# Patient Record
Sex: Female | Born: 1965
Health system: Southern US, Academic
[De-identification: ages and names within clinical notes are randomized; demographics above are authoritative.]

## PROBLEM LIST (undated history)

## (undated) DIAGNOSIS — F329 Major depressive disorder, single episode, unspecified: Secondary | ICD-10-CM

## (undated) DIAGNOSIS — K219 Gastro-esophageal reflux disease without esophagitis: Secondary | ICD-10-CM

## (undated) DIAGNOSIS — I10 Essential (primary) hypertension: Secondary | ICD-10-CM

## (undated) DIAGNOSIS — L409 Psoriasis, unspecified: Secondary | ICD-10-CM

## (undated) DIAGNOSIS — L4050 Arthropathic psoriasis, unspecified: Secondary | ICD-10-CM

## (undated) DIAGNOSIS — M5090 Cervical disc disorder, unspecified, unspecified cervical region: Secondary | ICD-10-CM

## (undated) DIAGNOSIS — E282 Polycystic ovarian syndrome: Secondary | ICD-10-CM

## (undated) DIAGNOSIS — F32A Depression, unspecified: Secondary | ICD-10-CM

## (undated) DIAGNOSIS — G43909 Migraine, unspecified, not intractable, without status migrainosus: Secondary | ICD-10-CM

## (undated) DIAGNOSIS — M722 Plantar fascial fibromatosis: Secondary | ICD-10-CM

## (undated) HISTORY — DX: Polycystic ovarian syndrome: E28.2

## (undated) HISTORY — DX: Plantar fascial fibromatosis: M72.2

## (undated) HISTORY — PX: CHOLECYSTECTOMY: SHX55

## (undated) HISTORY — DX: Cervical disc disorder, unspecified, unspecified cervical region: M50.90

## (undated) HISTORY — DX: Depression, unspecified: F32.A

## (undated) HISTORY — DX: Essential (primary) hypertension: I10

## (undated) HISTORY — PX: CARPAL TUNNEL RELEASE: SHX101

## (undated) HISTORY — DX: Gastro-esophageal reflux disease without esophagitis: K21.9

## (undated) HISTORY — PX: ENDOMETRIAL ABLATION: SHX621

## (undated) HISTORY — DX: Migraine, unspecified, not intractable, without status migrainosus: G43.909

## (undated) HISTORY — DX: Major depressive disorder, single episode, unspecified: F32.9

## (undated) HISTORY — DX: Arthropathic psoriasis, unspecified: L40.50

## (undated) HISTORY — DX: Psoriasis, unspecified: L40.9

---
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
## 2001-08-12 ENCOUNTER — Ambulatory Visit (HOSPITAL_COMMUNITY): Admission: RE | Admit: 2001-08-12 | Discharge: 2001-08-12 | Payer: Self-pay | Admitting: Family Medicine

## 2001-08-12 ENCOUNTER — Encounter: Payer: Self-pay | Admitting: Family Medicine

## 2002-01-08 ENCOUNTER — Ambulatory Visit (HOSPITAL_COMMUNITY): Admission: RE | Admit: 2002-01-08 | Discharge: 2002-01-08 | Payer: Self-pay | Admitting: Family Medicine

## 2002-01-08 ENCOUNTER — Encounter: Payer: Self-pay | Admitting: Family Medicine

## 2002-02-21 ENCOUNTER — Ambulatory Visit (HOSPITAL_COMMUNITY): Admission: RE | Admit: 2002-02-21 | Discharge: 2002-02-21 | Payer: Self-pay

## 2002-05-20 ENCOUNTER — Ambulatory Visit (HOSPITAL_COMMUNITY): Admission: RE | Admit: 2002-05-20 | Discharge: 2002-05-20 | Payer: Self-pay | Admitting: Orthopedic Surgery

## 2002-05-20 ENCOUNTER — Encounter: Payer: Self-pay | Admitting: Family Medicine

## 2002-05-20 ENCOUNTER — Ambulatory Visit (HOSPITAL_COMMUNITY): Admission: RE | Admit: 2002-05-20 | Discharge: 2002-05-20 | Payer: Self-pay | Admitting: Family Medicine

## 2005-09-08 ENCOUNTER — Encounter: Admission: RE | Admit: 2005-09-08 | Discharge: 2005-09-08 | Payer: Self-pay | Admitting: Family Medicine

## 2006-03-11 ENCOUNTER — Ambulatory Visit (HOSPITAL_COMMUNITY): Admission: RE | Admit: 2006-03-11 | Discharge: 2006-03-12 | Payer: Self-pay | Admitting: Neurosurgery

## 2006-06-15 ENCOUNTER — Encounter: Admission: RE | Admit: 2006-06-15 | Discharge: 2006-06-15 | Payer: Self-pay | Admitting: Neurosurgery

## 2006-06-19 ENCOUNTER — Encounter: Admission: RE | Admit: 2006-06-19 | Discharge: 2006-06-19 | Payer: Self-pay | Admitting: Neurosurgery

## 2006-12-11 ENCOUNTER — Encounter: Admission: RE | Admit: 2006-12-11 | Discharge: 2006-12-11 | Payer: Self-pay | Admitting: Neurosurgery

## 2007-04-22 ENCOUNTER — Inpatient Hospital Stay (HOSPITAL_COMMUNITY): Admission: RE | Admit: 2007-04-22 | Discharge: 2007-04-25 | Payer: Self-pay | Admitting: Neurosurgery

## 2007-04-27 ENCOUNTER — Observation Stay (HOSPITAL_COMMUNITY): Admission: AD | Admit: 2007-04-27 | Discharge: 2007-04-29 | Payer: Self-pay | Admitting: Neurosurgery

## 2008-08-05 IMAGING — CR DG CHEST 2V
2 series · 2 of 2 positions shown · non-contrast
Comparison: None.

CLINICAL DATA: 41 year-old with cervical pain. Preadmit for [DATE]. Hypertension. Nonsmoker. History of chest pain. Cervical C5-6, C6-7.
 CHEST - 2 VIEW:

[view not recorded (1 of 2)]
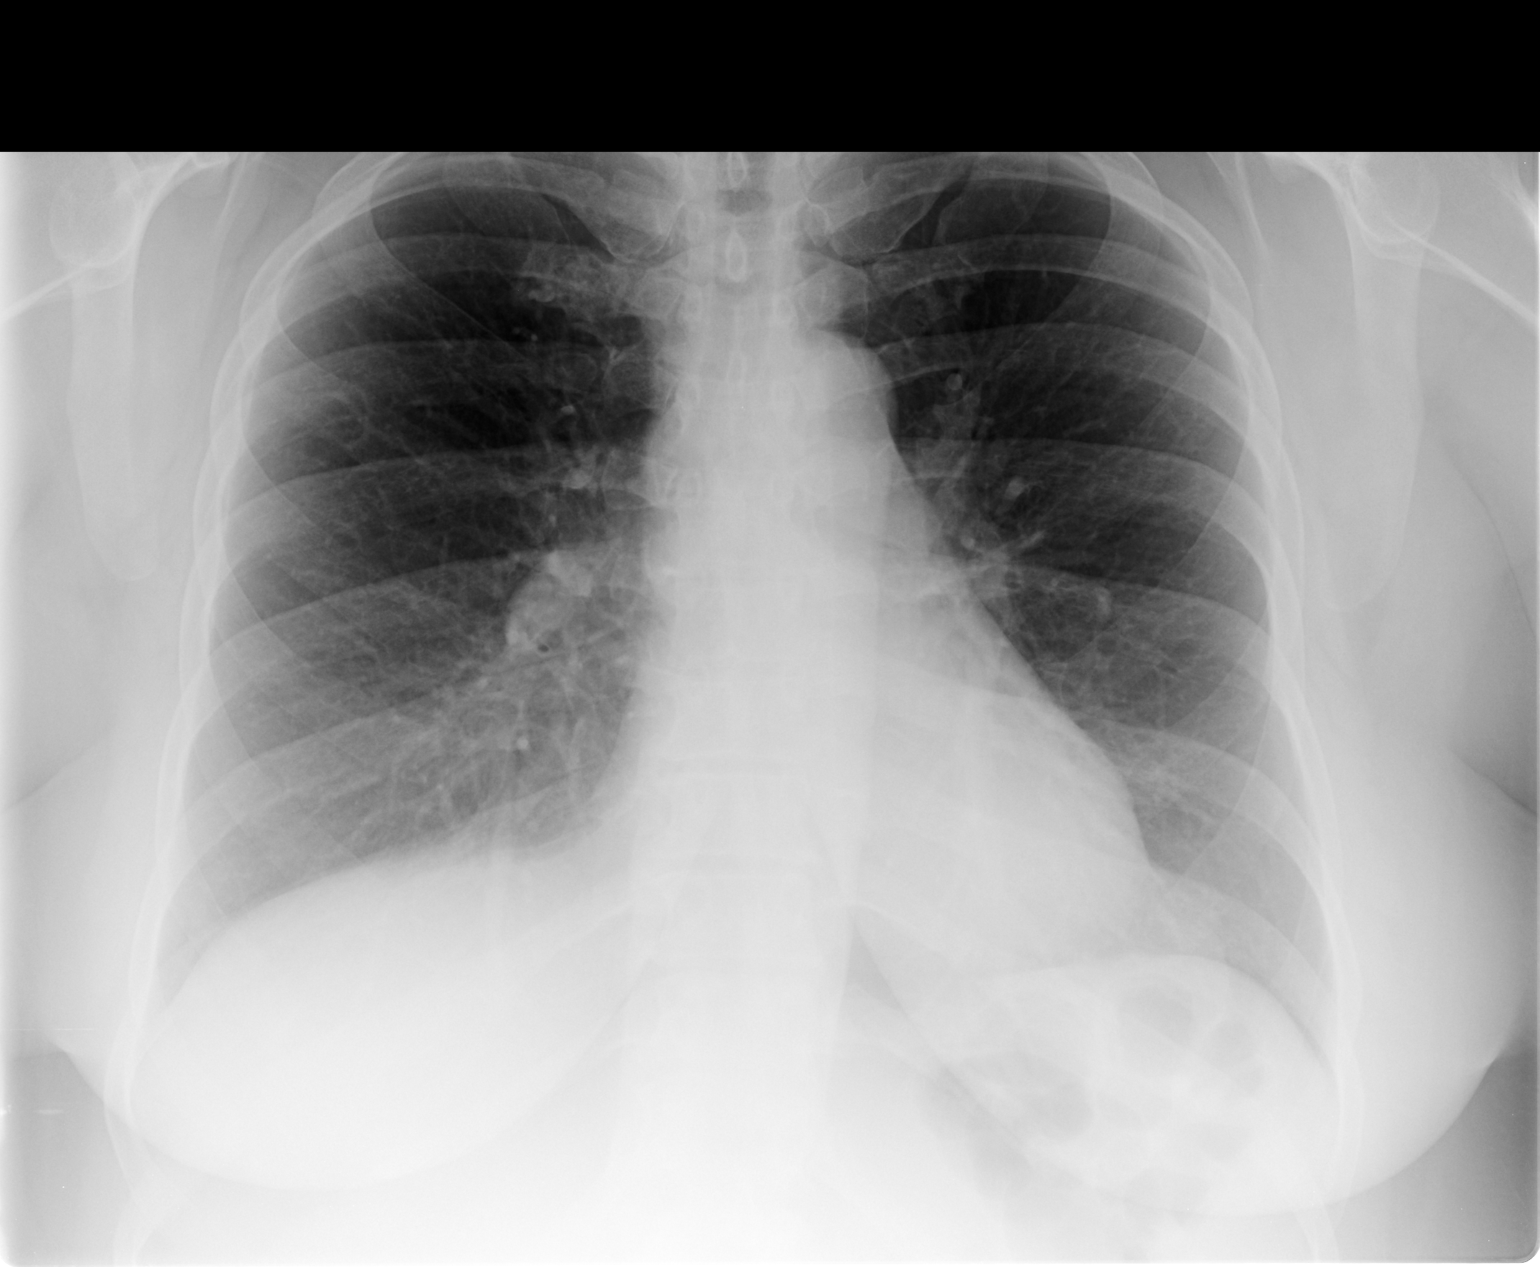

[view not recorded (2 of 2)]
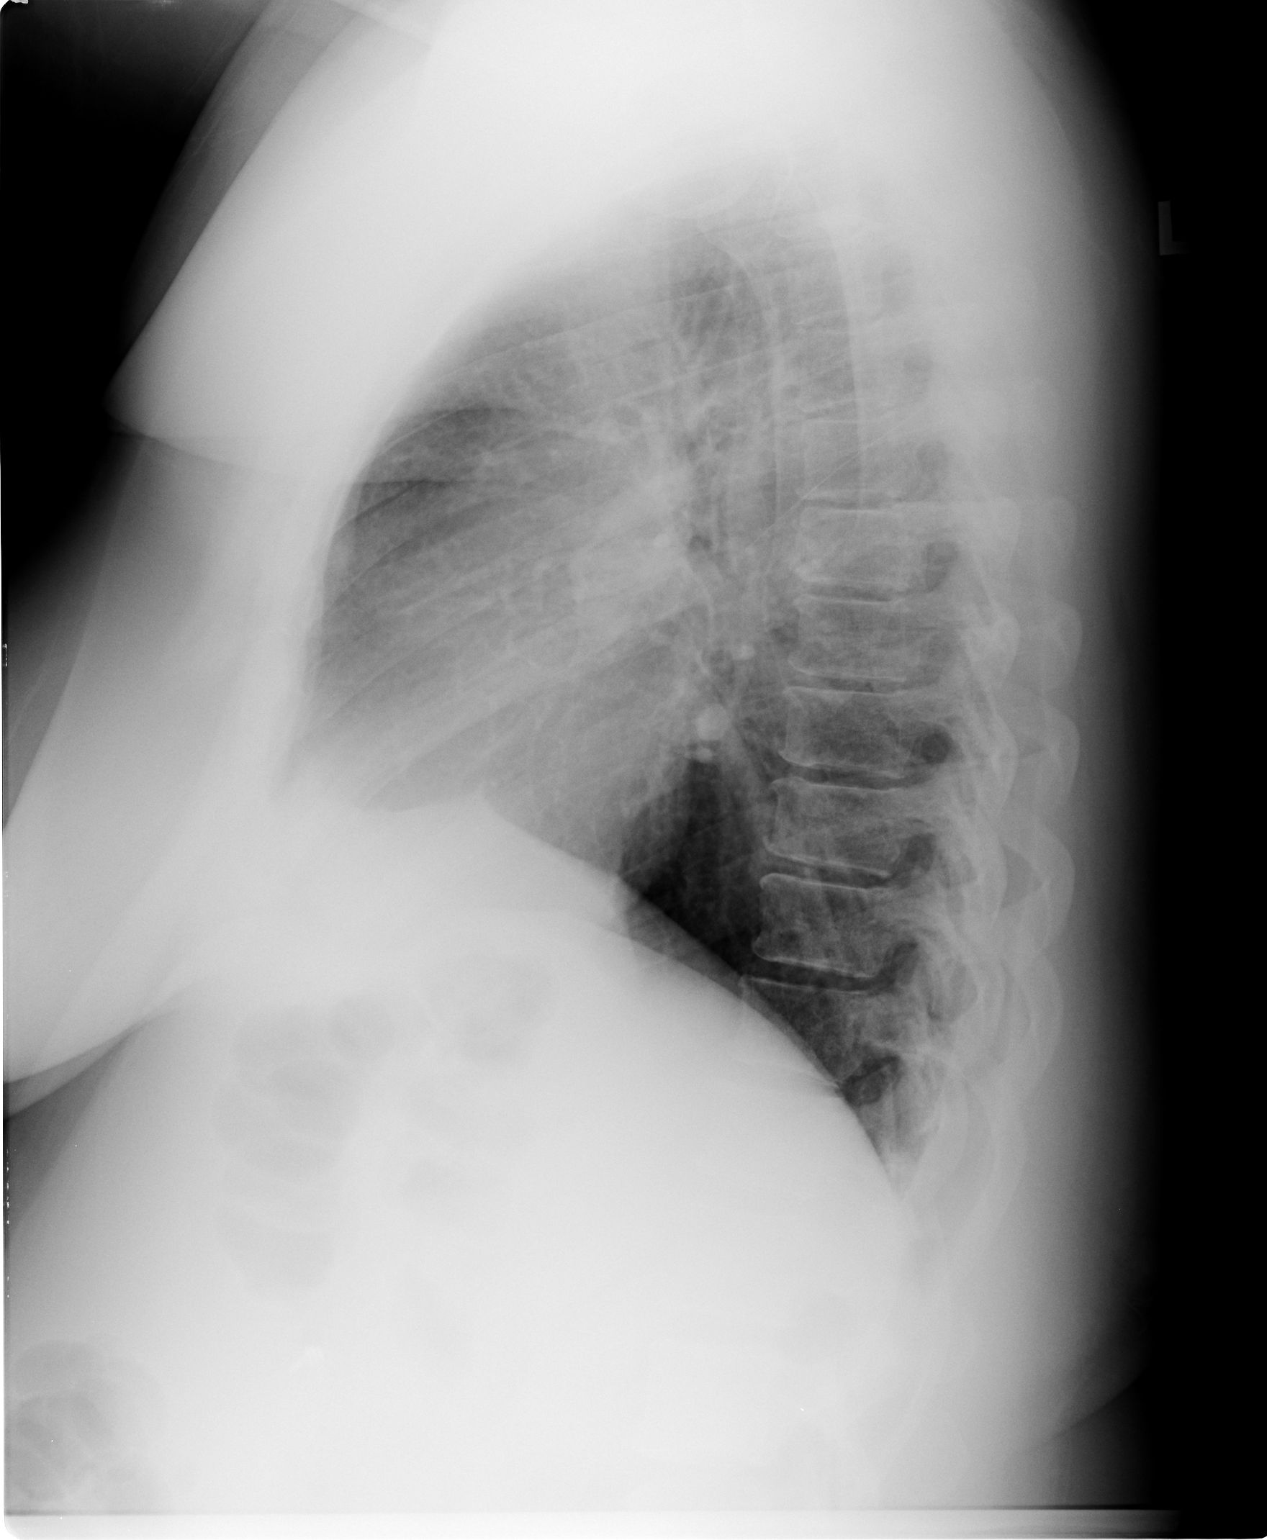

[2 of 2 positions shown; findings below may reference images not displayed]

FINDINGS: The heart size and mediastinal contours are within normal limits.  Both lungs are clear.  The visualized skeletal structures are unremarkable. Patient has had prior lower cervical fusion. Surgical clips are seen in the upper abdomen.
IMPRESSION: No active cardiopulmonary disease.

## 2008-08-08 IMAGING — CR DG CERVICAL SPINE 2 OR 3 VIEWS
1 series · 1 of 1 positions shown · non-contrast
Comparison: none

CLINICAL DATA: C6-7 posterior cervical fusion.
 PORTABLE LATERAL CERVICAL SPINE ? 2 VIEWS ? 04/22/07:

[view not recorded]
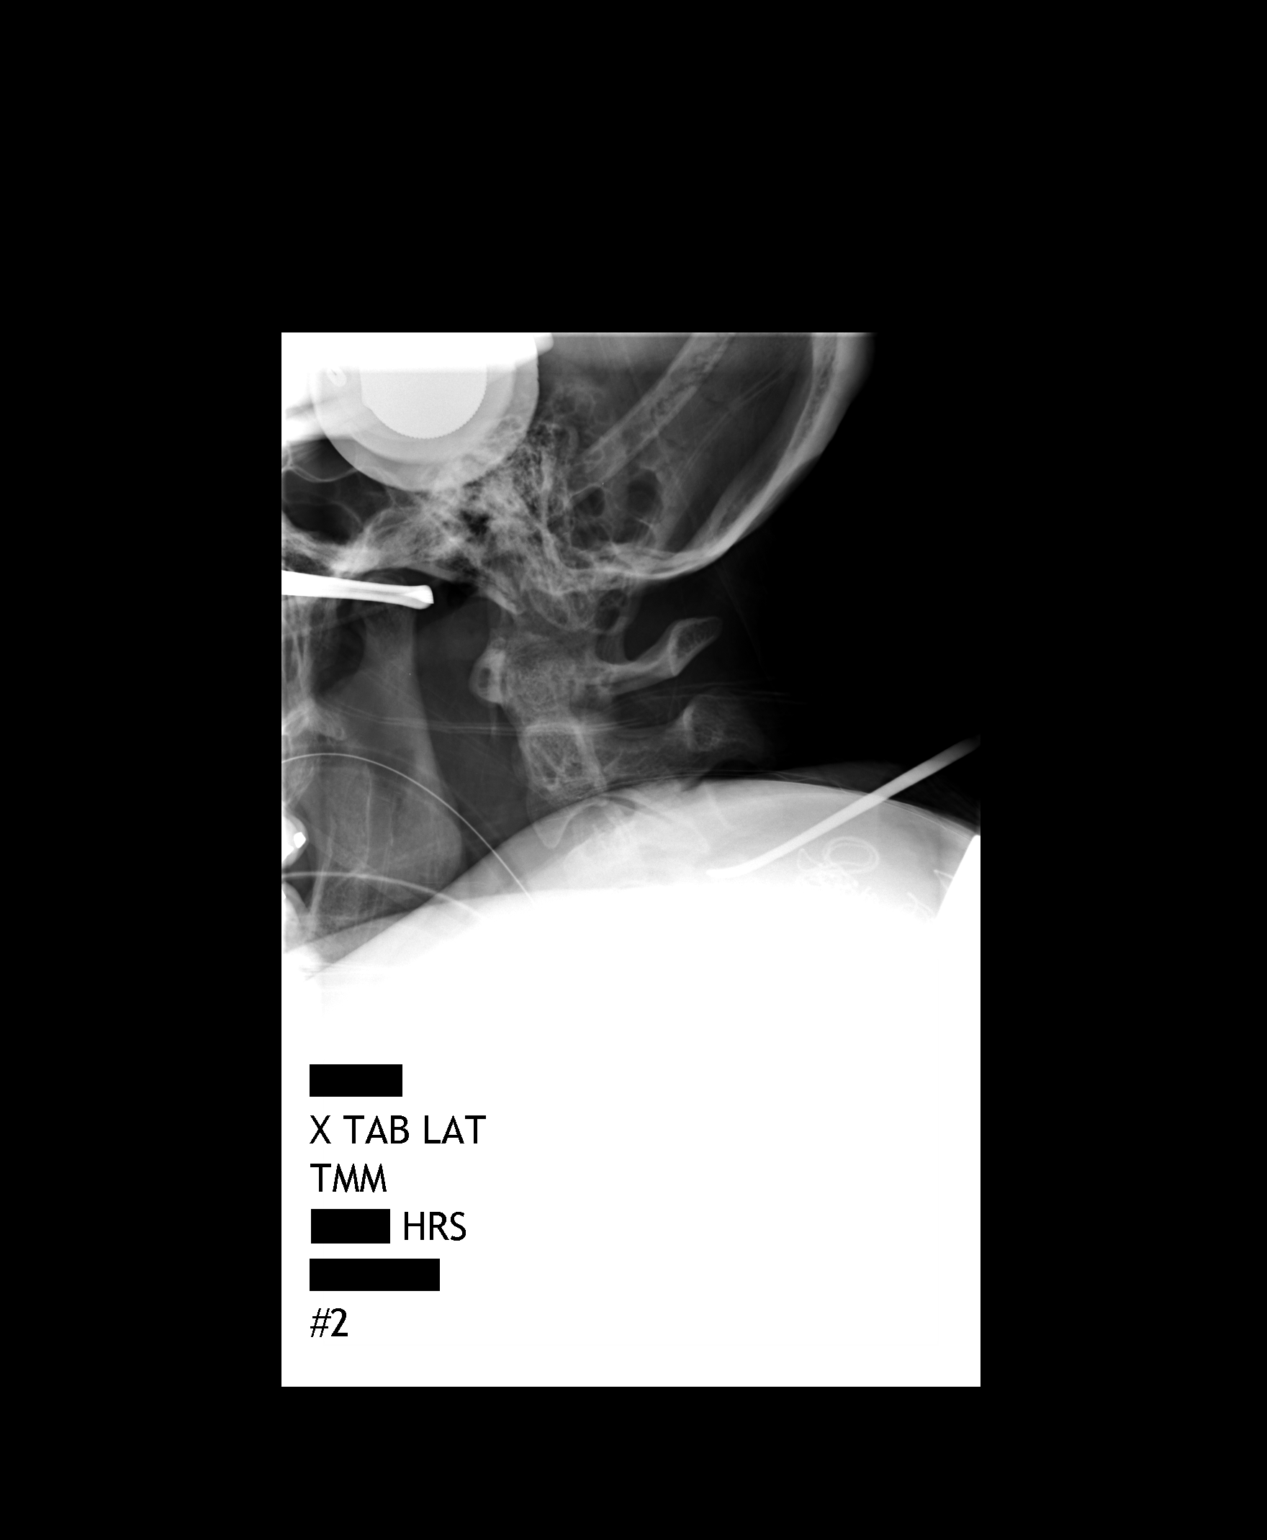

[1 of 1 positions shown; findings below may reference images not displayed]

FINDINGS: An initial film shows a probe between the spinous processes of C3 and C4.
 The second film shows a probe at the level of the lateral mass of C3, between the spinous processes of C3 and C4.  Sponges are in place along the operative approach.  Below that, there is obscuration because of shoulder density.
IMPRESSION: As discussed above.

## 2010-10-27 ENCOUNTER — Encounter: Payer: Self-pay | Admitting: Neurosurgery

## 2010-12-25 ENCOUNTER — Other Ambulatory Visit: Payer: Self-pay | Admitting: Neurosurgery

## 2010-12-25 DIAGNOSIS — M542 Cervicalgia: Secondary | ICD-10-CM

## 2010-12-29 ENCOUNTER — Other Ambulatory Visit: Payer: Self-pay

## 2011-01-09 ENCOUNTER — Inpatient Hospital Stay: Admission: RE | Admit: 2011-01-09 | Payer: Self-pay | Source: Ambulatory Visit

## 2011-02-18 NOTE — Op Note (Signed)
NAMEANGELICA, Meredith [name]             ACCOUNT NO.:  [id number]   MEDICAL RECORD NO.:  [id number]          PATIENT TYPE:  INP   LOCATION:  3005                         FACILITY:  [facility]   PHYSICIAN:  Cristi Loron, M.D.DATE OF BIRTH:  May 20, 1966   DATE OF PROCEDURE:  04/22/2007  DATE OF DISCHARGE:                               OPERATIVE REPORT   BRIEF HISTORY:  The patient is a 45 year old white female who I  performed a C6-7 anterior cervical diskectomy fusion plating for a  herniated disk almost a year ago.  The patient's arm pain resolved  nicely, but she has had persistent neck pain.  Follow-up x-rays have  demonstrated the patient developed a pseudoarthrosis.  I discussed the  various treatment options with the patient including surgery.  She has  weighed the risks, benefits and alternatives to surgery and decided to  proceed with a posterior cervical fusion.   PREOPERATIVE DIAGNOSIS:  C5-6 pseudoarthrosis, cervicalgia.   POSTOPERATIVE DIAGNOSIS:  C5-6 pseudoarthrosis, cervicalgia.   PROCEDURE:  Posterior C6-7 fusion with local morselized autograft bone  and bone morphogenic protein; posterior C6-7 instrumentation with  titanium lateral mass screws and rods.   SURGEON:  Dr. Delma Officer.   ASSISTANT:  Dr. Barnett Abu.   ANESTHESIA:  General endotracheal anesthesia.   ESTIMATED BLOOD LOSS:  100 mL.   SPECIMENS:  None.   DRAINS:  None.   COMPLICATIONS:  None.   PROCEDURE:  The patient was brought to the operating room by anesthesia  team.  General endotracheal anesthesia was induced.  The Mayfield three-  point head rest was applied to the patient's calvarium.  She was  carefully turned to the prone position on chest rolls.  Her neck was  flexed and was in the military tuck position, exposing her occipital and  posterior cervical region.  This region was then shaved and prepared  with Betadine scrub and Betadine solution.  Sterile drapes were applied.  I then  injected the area to be incised with Marcaine with epinephrine  solution.  I used a scalpel to make a linear midline incision over the  C6-7 interspace.  I used electrocautery to perform a bilateral  subperiosteal dissection exposing spinous process of lamina of C4 down  to T1.  We obtained intraoperative radiograph to confirm our location  and inserted the [facility] retractor for exposure.  We attempted to use  fluoroscopy but, because of the patient's body habitus, we were unable  to visualize any lower than C4 vertebra.  We therefore used standard  trajectories and used the power drill to drill a 14 mm hole in the  lateral mass of C6 and C7 bilaterally, of course in a cephalad direction  and lateral direction and standard trajectories, we did not encounter  any unexpected bleeding.  We then placed 14 mm polyaxial screws at C6-C7  bilaterally.  We then connected the unilateral screws with a rod and  placed the caps and tightened them appropriately completing the  instrumentation.   Having completed the instrumentation, we now turned attention to  arthrodesis.  We used a high-speed drill to decorticate the  lamina and  lateral masses at C6 and C7 bilaterally.  We saved the bone and used it  as local autograft bone.  We packed it  into the facettes.  We then laid  a combination of bone morphogenic protein soaked collagen spine sponges  over the decorticated posterolateral structures and then laid Vitoss  over that.  This completed the posterolateral arthrodesis.  We then  obtained hemostasis using bipolar electrocautery and then removed the  retractor.  We reapproximated the patient's cervical thoracic fascia  with interrupted #1 Vicryl suture, subcutaneous tissue with interrupted  2-0 Vicryl suture and skin with Steri-Strips and Benzoin.  The wound was  then coated with bacitracin ointment and sterile dressing applied.  The  drapes were removed and the patient was subsequently returned  to supine  position and the [place] three-point headrest was removed from her  calvarium.  The patient was subsequently extubated by the anesthesia  team and transported to the post anesthesia care unit in stable  condition.  All sponge, instrument and needle counts correct at the end  of this case.      Cristi Loron, M.D.  Electronically Signed     JDJ/MEDQ  D:  04/22/2007  T:  04/23/2007  Job:  [id number]

## 2011-02-18 NOTE — Op Note (Signed)
[name], Meredith [name]             ACCOUNT NO.:  [id number]   MEDICAL RECORD NO.:  [id number]          PATIENT TYPE:  INP   LOCATION:  3038                         FACILITY:  [facility]   PHYSICIAN:  Cristi Loron, M.D.DATE OF BIRTH:  [birth year]   DATE OF PROCEDURE:  04/27/2007  DATE OF DISCHARGE:                               OPERATIVE REPORT   BRIEF HISTORY:  The patient is a 45 year old white female who developed  a pseudoarthrosis at C6-7.  I performed a posterior cervical fusion on  her on April 22, 2007.  The surgery went well and her postoperative  course was unremarkable, but she called and said she was having a lot of  neck pain and some swelling.  I had her come in the office and she did  have some swelling of the wound which did not appear to be an infection.  I suspected cervical hematoma.  I discussed the treatment option with  her, including evacuation of this hematoma.  The patient and her husband  weighed the risks, benefits and alternatives of surgery and decided to  proceed with the operation.   PREOP DIAGNOSIS:  Posterior cervical wound hematoma.   POSTOPERATIVE DIAGNOSIS:  Posterior cervical wound hematoma.   PROCEDURE:  Incision and drainage of posterior cervical wound with  hematoma and placement of a Jackson-Pratt drain.   SURGEON:  Dr. Delma Officer.   ASSISTANT:  None.   ANESTHESIA:  Endotracheal.   ESTIMATED BLOOD LOSS:  Minimal.   SPECIMENS:  Wound cultures.   COMPLICATIONS:  None.   PROCEDURE IN DETAIL:  The patient was brought to the operating room by  the anesthesia team.  General endotracheal anesthesia was induced.  The  Mayfield three-point headrest rest was applied to the patient's  calvarium.  The patient was then carefully turned to the prone position  on chest rolls.  Her posterior cervical region was then prepared with  Betadine scrub and Betadine solution.  Sterile drapes were applied and  then I used a scalpel to incise through the  patient's fresh surgical  incision.  Upon doing this, I encountered a typical semi-liquefied  hematoma.  I did not see anything that looked suspicious for infection,  but to be on the safe side, I took some cultures of this presumed  hematoma.  I evacuated the hematoma using suction irrigation.  We  inspected operative bed.  I did not see any evidence of any bleeding.  We then irrigated the wound out with bacitracin solution.  I placed a 10-  mm, flat Jackson-Pratt drain above the lamina and tunneled it out  through a separate stab wound.  I then reapproximated the patient's  cervical thoracic fascia with interrupted 1-Vicryl suture, the  subcutaneous tissue with interrupted 2-0 Vicryl suture and the skin with  Steri-Strips and Benzoin.  The wound was then coated with bacitracin  ointment and a sterile dressing applied.  The drapes were removed.  The  patient was subsequently returned to supine position.  The  Mayfield three-point headrest was removed for calvarium and the patient  subsequently extubated by the anesthesia team and transported  to the  postanesthesia care unit in stable condition.  All sponge, instrument  and needle counts were correct at the end of the case.      Cristi Loron, M.D.  Electronically Signed     JDJ/MEDQ  D:  04/27/2007  T:  04/28/2007  Job:  [id number]

## 2011-02-18 NOTE — Discharge Summary (Signed)
[name], [name]             ACCOUNT NO.:  [id number]   MEDICAL RECORD NO.:  [id number]          PATIENT TYPE:  INP   LOCATION:  3005                         FACILITY:  [facility]   PHYSICIAN:  Payton Doughty, M.D.      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   PROCEDURE:  C5-6 posterior cervical fusion.   SURGEON:  Dr. Channing Mutters   COMPLICATIONS:  None.   ATTENDING:  Dr. Val Riles 45 year old right-handed white girl whose history and physical is  recounted in the chart.  She had an anterior at 6-7 a year ago and has  developed spondylosis and presented for augmentation.  General exam is  intact save for obesity.  She was admitted after ascertaining normal  laboratory values and underwent posterior cervical fusion.  Postoperatively, she has done reasonably well, has a lot of pain in her  neck and across her shoulders.  Her incision had developed some  swelling, it is nonfluctuant and probably represents a small wound  hematoma.  Currently, her strength is full; her incision is dry.  Pain  meds are Percocet and Valium; she got one dose of Toradol.  She is being  discharged home in the care of her family.  Her followup will be in the  [facility] offices via phone call to Dr. Lovell Sheehan on Monday.           ______________________________  Payton Doughty, M.D.     MWR/MEDQ  D:  04/25/2007  T:  04/25/2007  Job:  [id number]

## 2011-02-21 NOTE — Op Note (Signed)
Meredith [name], Meredith [name]             ACCOUNT NO.:  [id number]   MEDICAL RECORD NO.:  [id number]          PATIENT TYPE:  OIB   LOCATION:  3172                         FACILITY:  [facility]   PHYSICIAN:  Cristi Loron, M.D.DATE OF BIRTH:  11-Mar-1966   DATE OF PROCEDURE:  03/11/2006  DATE OF DISCHARGE:                                 OPERATIVE REPORT   BRIEF HISTORY:  The patient is a 45 year old white female who suffers from  neck and right arm pain consistent with a right C7 radiculopathy.  The  patient failed medical management and was worked up with a cervical MRI  which demonstrated a herniated disk at C6-C7 on the right.  I discussed the  various treatment options with the patient including surgery.  The patient  has weighed the risks, benefits and alternatives to surgery and has decided  to proceed with a C6-C7 anterior cervical diskectomy, fusion, and plating.   PREOPERATIVE DIAGNOSIS:  C6-C7 herniated nucleus pulposis, spinal stenosis,  cervical radiculopathy, cervicalgia, and spondylosis.   POSTOPERATIVE DIAGNOSIS:  C6-C7 herniated nucleus pulposis, spinal stenosis,  cervical radiculopathy, cervicalgia, and spondylosis.   PROCEDURE:  C6-C7 extensive anterior cervical diskectomy/decompression; C6-  C7 interbody iliac crest allograft arthrodesis; C6-C7 anterior cervical  plating (Codman Slimlock titanium plate and screws).   SURGEON:  Cristi Loron, M.D.   ASSISTANT:  Stefani Dama, M.D.   ANESTHESIA:  General endotracheal anesthesia.   ESTIMATED BLOOD LOSS:  75 mL.   SPECIMENS:  None.   DRAINS:  None.   COMPLICATIONS:  None.   DESCRIPTION OF PROCEDURE:  The patient is brought to the operating room by  the anesthesia team and general endotracheal anesthesia was induced.  The  patient remained in the supine position.  A roll was placed under the  patient's shoulders placing the neck in slight extension.  The anterior  cervical region was then prepared with  Betadine scrub and Betadine solution.  Sterile drapes were applied.  I then injected the area to be incised with  Marcaine with epinephrine solution.  I used a scalpel to make a transverse  incision in the patient's left anterior neck.  I used the Metzenbaum  scissors to divide the platysma muscle and to dissect medial to the  sternocleidomastoid muscle, jugular vein, and carotid artery.  I carefully  dissected down towards the anterior cervical spine and identified the  esophagus and retracted it medially.  I then cleared the soft tissue from  the anterior cervical spine and inserted a bent spinal needle at the upper  exposed intervertebral disk space.  I then obtained interoperative  radiograph to confirm our location.   We then counted down to the C6-C7 intervertebral disk and then used  electrocautery to detach the medial border of the longus colli muscle  bilaterally from the C6-C7 intervertebral space.  In order to gain adequate  exposure to the C6-C7 interspace, we did ligate a small artery running  towards the midline after we placed hemoclips on it.  This gave [place] adequate  exposure of the C6-C7 intervertebral disk space.  We then inserted the  [facility]  self-retaining retractor for exposure and then incised the C7  vertebral disk with a 15 blade scalpel.  The disk space was quite  spondylotic. We then used a high speed drill to remove some of the ventral  spondylosis to gain better access to the disk space.  We then performed  partial diskectomy using the pituitary forceps and the Carlens curets.  We  then inserted distraction screws at C6 and C7 and distracted the interspace,  and then used the high-speed drill to decorticate the vertebral endplates of  C6-C7 and drill away the remainder of the C6-C7 intervertebral disk, drill  away some posterior spondylosis, and to thin out the posterior longitudinal  ligament.  We then incised the ligament with an arachnoid knife and then   removed it with the Kerrison punch under cutting the vertebral endplates to  decompress the thecal sac.  We then performed a foraminotomy about the  bilateral C7 nerve roots.  Of note, on the right side we encountered a large  herniated disk which was compressing the right C7 nerve root.  We removed it  with the Kerrison punch and with the pituitary forceps in multiple  fragments.  At this point, we were done with the decompression.   We now turned our attention to the arthrodesis.  We obtained iliac crest  tricortical allograft bone graft and fashioned it to these approximate  dimensions, 6 mm height 1 cm in depth.  We inserted the bone graft in the  distracted C6-C7 interspace and then removed the distraction screws.  There  was a good snug fit of the bone graft in the interspace.   We now turned our attention to anterior spinal instrumentation.  We used the  high-speed drill to remove some ventral spondylosis from the C6-C7 disk  space so that the plate would lay down flat and then selected the  appropriate length Codman Slimlock anterior cervical plate and laid it along  the anterior aspect of the vertebral bodies at C6 and C7.  We then drilled  two 12 mm holes at C6, two at C7, then secured the plate to the vertebral  bodies by placing two 12 mm self-tapping screws, two at C6, two at C7.  We  then obtained an interoperative radiograph.  There was no way to see the  plate because of the patient's body habitus but it looked good in vivo.  We  then secured the screws to the plate by locking each cam.   We then obtained hemostasis using bipolar cautery.  We irrigated the wound  out with bacitracin solution and removed the retractor and then we inspected  the esophagus for any damage and was none apparent.  We then reapproximated  the patient's platysma muscle with interrupted 3-0 Vicryl suture, the subcutaneous tissues with interrupted 3-0 Vicryl suture, and the skin with  Steri-Strips  and Benzoin.  The wound was then coated with Bacitracin  ointment, a sterile dressing was applied, the drapes were removed.  The  patient was subsequently extubated by the anesthesia team and transported to  the post anesthesia care unit in stable condition.  All sponge, instrument  and needle counts were correct at the end of the case.      Cristi Loron, M.D.  Electronically Signed     JDJ/MEDQ  D:  03/11/2006  T:  03/11/2006  Job:  [id number]

## 2011-03-02 ENCOUNTER — Ambulatory Visit
Admission: RE | Admit: 2011-03-02 | Discharge: 2011-03-02 | Disposition: A | Discharge status: Home / Self Care | Payer: BC Managed Care – PPO | Source: Ambulatory Visit | Attending: Neurosurgery | Admitting: Neurosurgery

## 2011-03-02 DIAGNOSIS — M542 Cervicalgia: Secondary | ICD-10-CM

## 2011-06-12 ENCOUNTER — Other Ambulatory Visit (HOSPITAL_COMMUNITY): Payer: Self-pay | Admitting: Neurosurgery

## 2011-06-12 DIAGNOSIS — M542 Cervicalgia: Secondary | ICD-10-CM

## 2011-07-11 ENCOUNTER — Other Ambulatory Visit (HOSPITAL_COMMUNITY): Payer: Self-pay | Admitting: Neurosurgery

## 2011-07-11 ENCOUNTER — Ambulatory Visit (HOSPITAL_COMMUNITY)
Admission: RE | Admit: 2011-07-11 | Discharge: 2011-07-11 | Disposition: A | Discharge status: Home / Self Care | Payer: BC Managed Care – PPO | Source: Ambulatory Visit | Attending: Neurosurgery | Admitting: Neurosurgery

## 2011-07-11 DIAGNOSIS — M549 Dorsalgia, unspecified: Secondary | ICD-10-CM

## 2011-07-11 DIAGNOSIS — M5030 Other cervical disc degeneration, unspecified cervical region: Secondary | ICD-10-CM | POA: Insufficient documentation

## 2011-07-11 DIAGNOSIS — M542 Cervicalgia: Secondary | ICD-10-CM | POA: Insufficient documentation

## 2011-07-11 DIAGNOSIS — Z981 Arthrodesis status: Secondary | ICD-10-CM | POA: Insufficient documentation

## 2011-07-11 DIAGNOSIS — M546 Pain in thoracic spine: Secondary | ICD-10-CM | POA: Insufficient documentation

## 2011-07-11 MED ORDER — IOHEXOL 300 MG/ML  SOLN
10.0000 mL | Freq: Once | INTRAMUSCULAR | Status: AC | PRN
  Administered 2011-07-11: 10 mL via INTRATHECAL

## 2011-07-21 LAB — SEDIMENTATION RATE: Sed Rate: 36 — ABNORMAL HIGH

## 2011-07-21 LAB — BASIC METABOLIC PANEL
CO2: 31
Calcium: 8.6
Creatinine, Ser: 0.56
Glucose, Bld: 95

## 2011-07-21 LAB — CBC
Hemoglobin: 11.7 — ABNORMAL LOW
Platelets: 418 — ABNORMAL HIGH

## 2011-07-21 LAB — WOUND CULTURE: Culture: NO GROWTH

## 2011-07-21 LAB — ANAEROBIC CULTURE

## 2011-07-22 LAB — CBC
HCT: 34.7 — ABNORMAL LOW
Hemoglobin: 11.7 — ABNORMAL LOW
MCV: 83.1
RDW: 15.6 — ABNORMAL HIGH
WBC: 8.7

## 2011-07-22 LAB — BASIC METABOLIC PANEL
BUN: 8
Calcium: 9.2
Creatinine, Ser: 0.46
Glucose, Bld: 112 — ABNORMAL HIGH
Potassium: 3.8

## 2011-12-03 ENCOUNTER — Telehealth: Payer: Self-pay | Admitting: Internal Medicine

## 2011-12-03 NOTE — Telephone Encounter (Signed)
Dr Corliss Skains called. Started on methotrexate jan 9. 2013 for psoriatic arthritis and then dc'ed due to nasal ulcer, cough, skin issues Nov 07, 2011. Now still with cough, dyspnea. So enbrel which was due to started today held. Please have her do full PFT andCXR and I will see her week after my elink < 2 weeks

## 2011-12-10 NOTE — Telephone Encounter (Signed)
I spoke with the pt and she states that her cough and SOB has completely resolved. She states she ended up going to the ER and they discovered her potassium level was low, and since this has been treated she is much better and does not feel like she needs an appt with pulmonary. Carron Curie, CMA

## 2011-12-10 NOTE — Telephone Encounter (Signed)
Ok thnaks. I will send this phone note to Dr Corliss Skains

## 2011-12-11 ENCOUNTER — Telehealth: Payer: Self-pay | Admitting: Internal Medicine

## 2011-12-11 NOTE — Telephone Encounter (Signed)
Pt aware of appt scheduled for 3/27 with pft

## 2011-12-31 ENCOUNTER — Encounter: Payer: Self-pay | Admitting: Pulmonary Disease

## 2011-12-31 ENCOUNTER — Ambulatory Visit (INDEPENDENT_AMBULATORY_CARE_PROVIDER_SITE_OTHER): Payer: BC Managed Care – PPO | Admitting: Internal Medicine

## 2011-12-31 ENCOUNTER — Encounter: Payer: Self-pay | Admitting: Internal Medicine

## 2011-12-31 VITALS — BP 140/90 | HR 69 | Temp 98.2°F | Ht 65.0 in | Wt 277.0 lb

## 2011-12-31 DIAGNOSIS — R0602 Shortness of breath: Secondary | ICD-10-CM

## 2011-12-31 DIAGNOSIS — R05 Cough: Secondary | ICD-10-CM

## 2011-12-31 DIAGNOSIS — R0600 Dyspnea, unspecified: Secondary | ICD-10-CM

## 2011-12-31 DIAGNOSIS — R0683 Snoring: Secondary | ICD-10-CM

## 2011-12-31 DIAGNOSIS — R0989 Other specified symptoms and signs involving the circulatory and respiratory systems: Secondary | ICD-10-CM

## 2011-12-31 DIAGNOSIS — R5383 Other fatigue: Secondary | ICD-10-CM

## 2011-12-31 DIAGNOSIS — R5381 Other malaise: Secondary | ICD-10-CM

## 2011-12-31 DIAGNOSIS — Z5181 Encounter for therapeutic drug level monitoring: Secondary | ICD-10-CM

## 2011-12-31 LAB — PULMONARY FUNCTION TEST

## 2011-12-31 MED ORDER — FLUTICASONE PROPIONATE 50 MCG/ACT NA SUSP: 2.0000 | Freq: Every day | NASAL | Status: AC

## 2011-12-31 NOTE — Patient Instructions (Signed)
#  Cough  - probably started due to methotrexate related cell sloughing but continues due to sinus drainage and acid reflux  - for sinus: agreed that you do not like netti pot but you will try  take generic fluticasone inhaler 2 squirts each nostril daily - for acid reflux: please use zegerid otc 20 or 40mg  once daily before breakfast   #Shortness of breath  - this could be due to weight, methotrexate cell sloughing and psoriatic arthritis - at this point report of a ct chest at [facility] and pft here is normal  - glad this is improving, if this does not resolve in a month call us we might have to consider additional tests  #[place]ane for enbrel  -  I do not see a reason you cannot have enbrel - standard risks apply   - ok to give BCG at work  #Fatigue  - could be from psoriatic arthritis and undiagnosed sleep apnea  - we have agreed to hold off sleep evaluation at your request  - monitor this and if you feel inclined to pursue sleep workup let me know  #Followup  - as needed per your request
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
## 2011-12-31 NOTE — Progress Notes (Signed)
PFT done today. 

## 2012-01-01 ENCOUNTER — Encounter: Payer: Self-pay | Admitting: Internal Medicine

## 2012-01-01 DIAGNOSIS — R0683 Snoring: Secondary | ICD-10-CM | POA: Insufficient documentation

## 2012-01-01 DIAGNOSIS — R5383 Other fatigue: Secondary | ICD-10-CM | POA: Insufficient documentation

## 2012-01-01 DIAGNOSIS — R05 Cough: Secondary | ICD-10-CM | POA: Insufficient documentation

## 2012-01-01 DIAGNOSIS — Z5181 Encounter for therapeutic drug level monitoring: Secondary | ICD-10-CM | POA: Insufficient documentation

## 2012-01-01 DIAGNOSIS — R0600 Dyspnea, unspecified: Secondary | ICD-10-CM | POA: Insufficient documentation

## 2012-01-01 NOTE — Assessment & Plan Note (Signed)
At this point I do not see elevated risk for enbrel beyond standard risk from pulmonary standpoint

## 2012-01-01 NOTE — Assessment & Plan Note (Signed)
#  Cough  - probably started due to methotrexate related cell sloughing but continues due to sinus drainage and acid reflux  - for sinus: we discussed and she does not like netti pot but she has agreed to try generic fluticasone inhaler 2 squirts each nostril daily - for acid reflux: please use zegerid otc 20 or 40mg  once daily before breakfast  - she is not interested in active followup and will call if needed

## 2012-01-01 NOTE — Assessment & Plan Note (Signed)
High pretest probability for sleep apnea. Does not want to see sleep doc or have sleep eval. Prefers getting psoriatric arthritis under controil and getting "life back" and working weight off body

## 2012-01-01 NOTE — Assessment & Plan Note (Signed)
Probably combination of sleep apnea that is undiagnosed, medical issues. She says she wlill monitor it

## 2012-01-01 NOTE — Assessment & Plan Note (Signed)
This appears to have started after methotrexate. There appears to be an asthma like component where dyspnea is worse with night air and this could reflect cell sloughing from methotrexate or baseline asthma as a child being reactivated. Her PFT and CT chest are normal and she will need a methacholine challenge test to sort it out but she she is not interested and wants to monitor it clinically. IN addition, obesity and fatigue  could be playing a role. She prefers to monitor it

## 2012-01-07 ENCOUNTER — Encounter: Payer: Self-pay | Admitting: Internal Medicine

## 2012-10-27 IMAGING — CT CT T SPINE W/ CM
3 of 7 series · 13 of 33 positions shown, 15 images · IV contrast (omnipaque)
Comparison: Cervical MRI 03/02/2011.

MYELOGRAM CERVICAL AND THORACIC

CLINICAL DATA: 45-year-old female with neck pain and mid to upper
back pain.
TECHNIQUE: Intrathecal contrast was administered by Dr. Tiger
Felisha  via lumbar puncture at the L4-L5 level. Following
injection of intrathecal Omnipaque contrast, spine imaging in
multiple projections was performed using fluoroscopy.

Fluoroscopy Time: 0.4 minutes.
TECHNIQUE: CT imaging of the cervical spine was performed after
intrathecal contrast administration.  Multiplanar CT image
reconstructions were also generated.
TECHNIQUE: CT imaging of the thoracic spine was performed after

[Series 4: 2mm axial soft tissue · axial · 0.32mm/px · z∈[-432,-194]mm · 6 of 167 slices shown]
[im 24/167  soft-tissue]
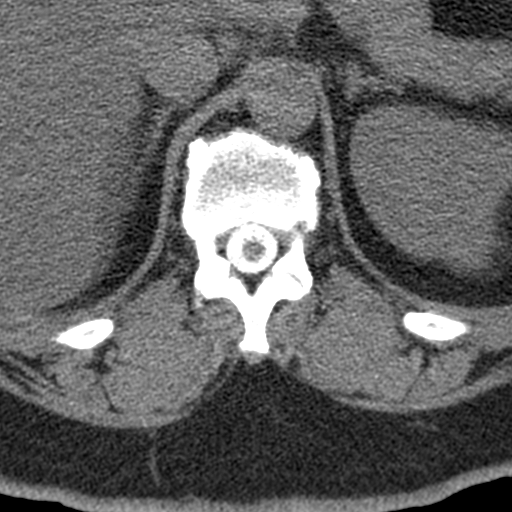
[im 48/167  soft-tissue]
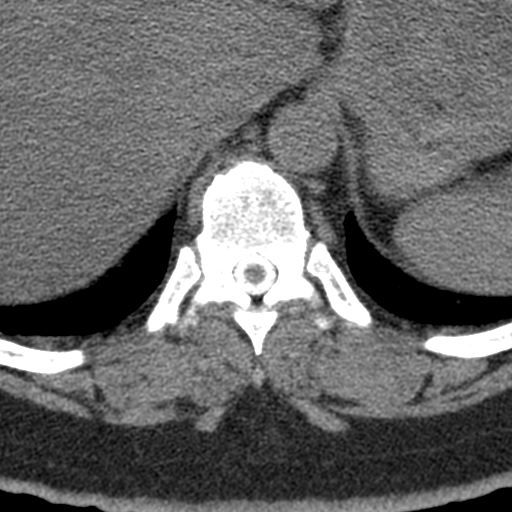
[im 72/167  soft-tissue]
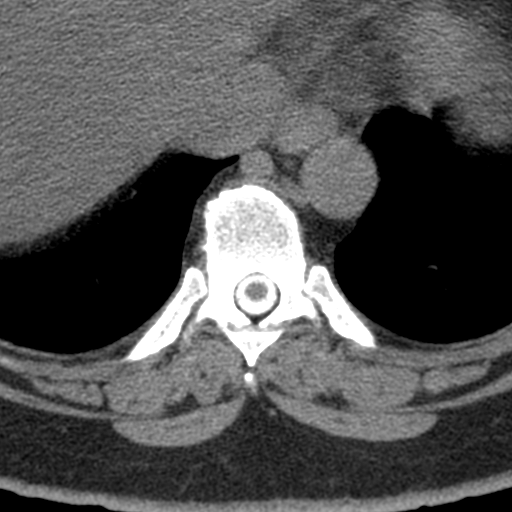
[im 95/167  soft-tissue]
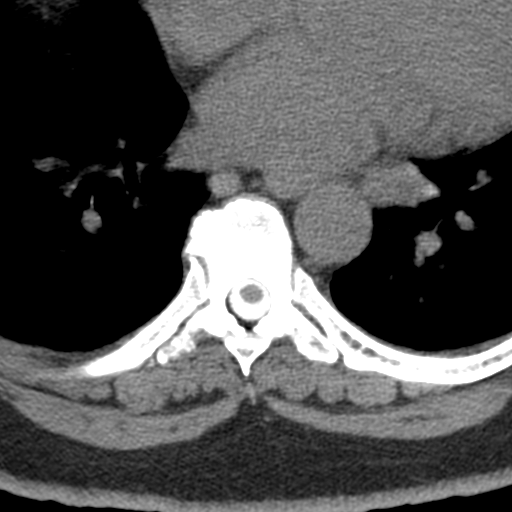
[im 119/167  soft-tissue]
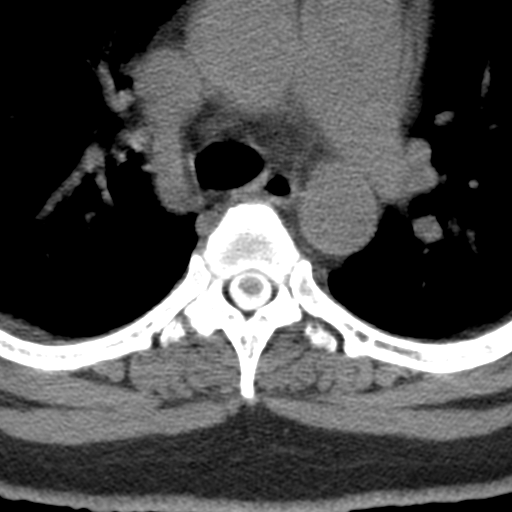
[im 143/167  soft-tissue]
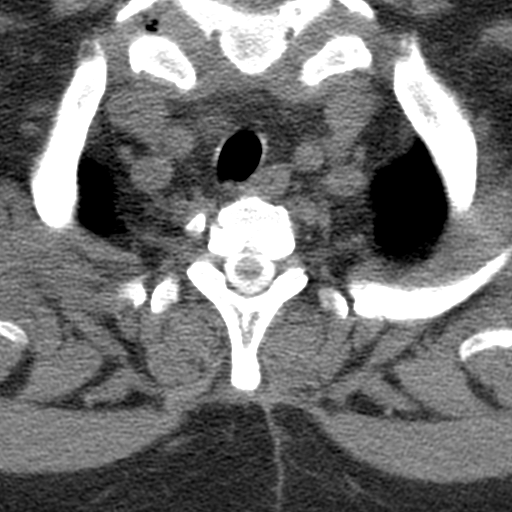

[Series 602: det sagittals · sagittal · 0.65mm/px · 5 of 40 slices shown, 6 images]
[im 14/40  bone]
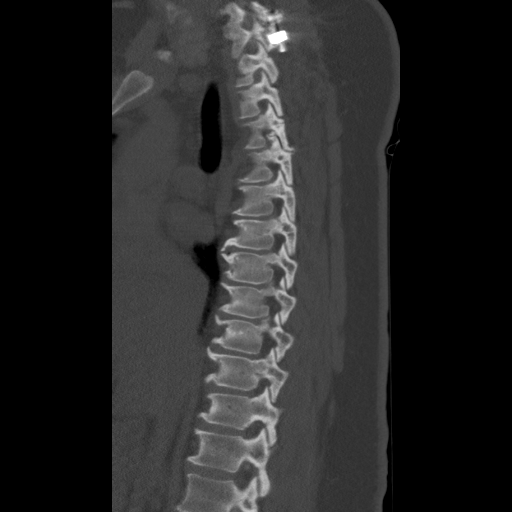
[im 17/40  bone]
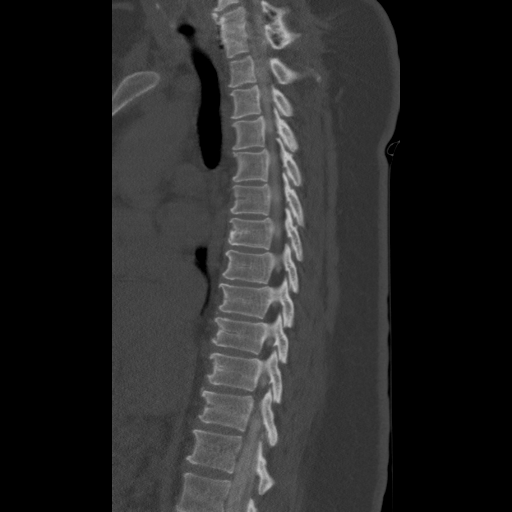
[im 20/40  soft-tissue]
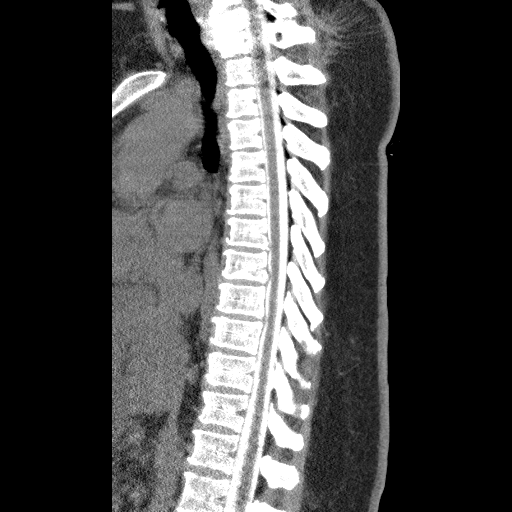
[im 20/40  bone]
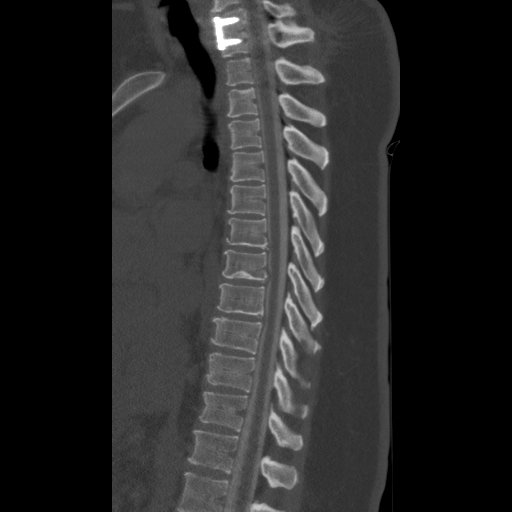
[im 23/40  bone]
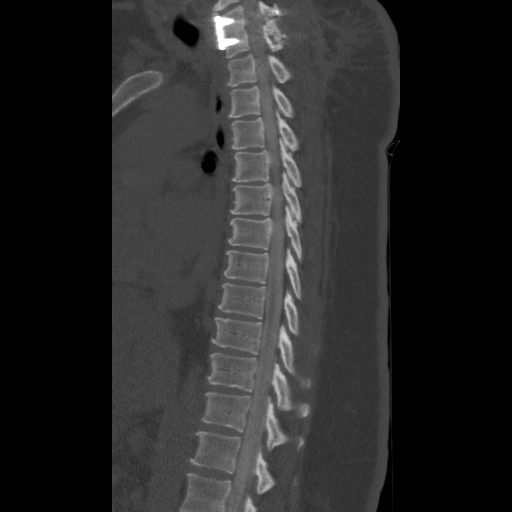
[im 27/40  bone]
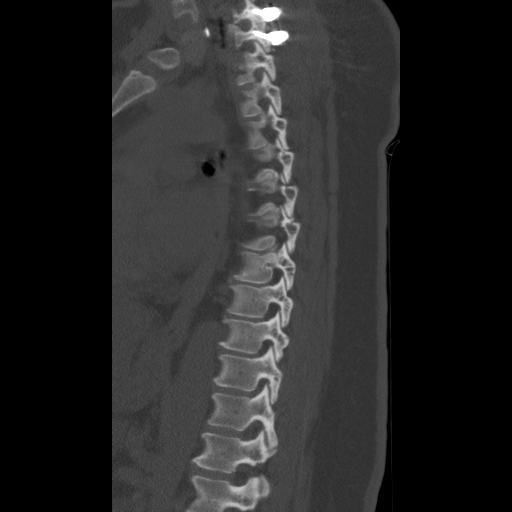

[Series 606: (person_name) spine · axial · 0.27mm/px · z∈[-404,-337]mm · 2 of 100 slices shown, 3 images]
[im 34/100  soft-tissue]
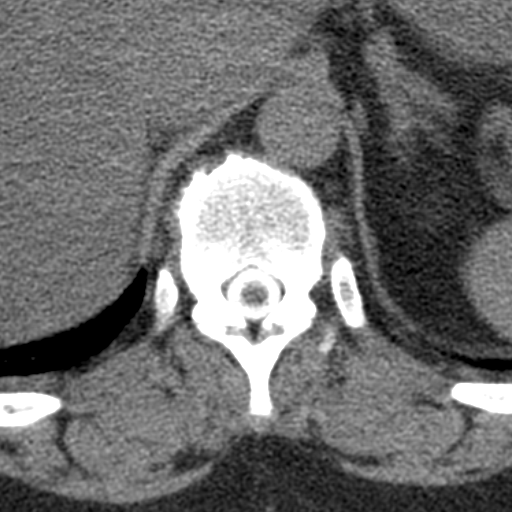
[im 34/100  bone]
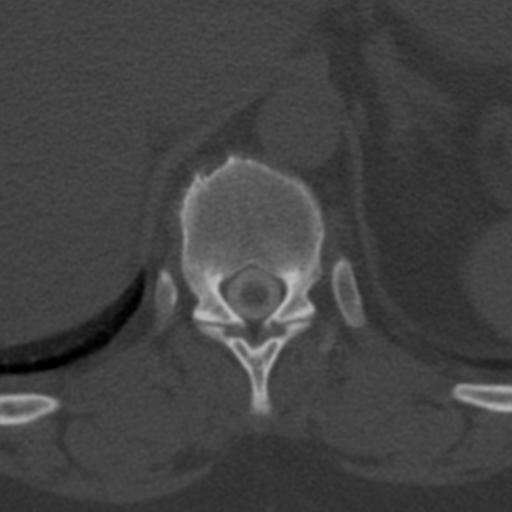
[im 67/100  bone]
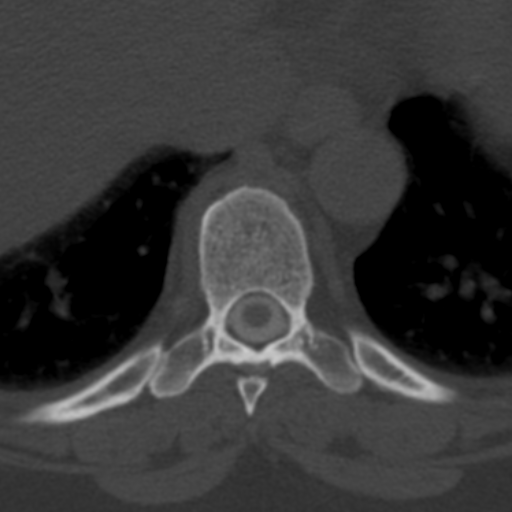

[13 of 33 positions shown; findings below may reference images not displayed]

FINDINGS: Good intrathecal contrast opacification.  Using gravity
contrast was transmitted into the cervical and thoracic spine.
ACDF hardware at C6-C7 with bilateral laminar hardware.  The lower
cervical levels difficult to visualize myelographic lead related to
body habitus, with no convincing cervical spinal stenosis.  Upper
cervical thecal sac widely patent.  Visualized thoracic thecal sac
also within normal limits.
IMPRESSION: 1.  Prior ACDF and posterior laminar hardware in place at C6-C7.
2. No convincing cervical or thoracic spinal stenosis by
myelography.
3. See post myelogram CT findings below.

CT MYELOGRAPHY CERVICAL SPINE
FINDINGS: Intrathecal/subarachnoid contrast.  Incidental
retropharyngeal course of the carotid arteries, more so the right.
Visualized skull base is intact.  No atlanto-occipital
dissociation.  Bilateral posterior element alignment is within
normal limits.  Cervicothoracic junction alignment is within normal
limits.  No acute osseous abnormality identified.  Visualized
paranasal sinuses and mastoids are clear.  Negative visualized
brain parenchyma.

C2-C3:  Mild facet hypertrophy is stable.  Mild uncovertebral
hypertrophy greater on the left.  No spinal stenosis.  No
significant foraminal stenosis.

C3-C4:  Stable facet hypertrophy.  No stenosis.

C4-C5:  Stable mild right facet hypertrophy.  Central disc
protrusion appears diminished.  The ventral CSF space is effaced
but no significant spinal stenosis suspected (AP thecal sac 10 mm
or greater).  No foraminal stenosis.

C5-C6:  Anterior eccentric disc osteophyte complex.  Previously
seen broad-based posterior disc protrusion is less apparent,
although the ventral CSF space is effaced.  Still the AP thecal sac
measures at least 9 mm without evidence of significant spinal
stenosis.  No foraminal stenosis.

C6-C7:  ACDF.  Solid interbody arthrodesis.  Anterior hardware
intact.  Posterior laminar hardware appears well placed and intact.
No stenosis.

C7-T1:  Negative.
IMPRESSION: 1.  C6-C7 ACDF with solid arthrodesis and no adverse features.
2.  Chronic C5-C6 disc degeneration, and upper cervical facet
hypertrophy.  But no convincing cervical spinal stenosis or neural
impingement.
3.  See thoracic post myelogram CT findings below.

CT MYELOGRAPHY THORACIC SPINE
FINDINGS: Mild respiratory motion artifact.  No focal lung
abnormality is evident.  Visualized noncontrast mediastinal soft
tissues within normal limits.  Visualized noncontrast upper
abdominal viscera within normal limits.

Normal thoracic segmentation.  Preserved thoracic vertebral height
and alignment. Bone mineralization is within normal limits.
Thoracic spinal cord appears normal.  Conus medullaris partially
visualized at L1.

T1-T2: Negative.

T2-T3: Moderate facet hypertrophy greater on the right where there
is vacuum phenomena.  Negative disc.  No stenosis.

T3-T4: Severe facet hypertrophy greater on the right where there is
extensive vacuum phenomena.  Negative disc.  Moderate right T3
foraminal stenosis related to facet spurring.

T4-T5: Mild to moderate facet hypertrophy slightly greater on the
right with trace vacuum phenomena.  Minimal disc bulge.  Mild left
uncovertebral hypertrophy.  No significant stenosis.

T5-T6: Moderate bilateral facet hypertrophy with vacuum phenomena.
Negative disc.  No stenosis.

T6-T7: Small partially calcified right paracentral disc protrusion
narrows the ventral CSF space but thecal sac is widely patent.
Mild facet hypertrophy.  No stenosis.

T7-T8: Small to moderate right paracentral disc protrusion effaces
the ventral CSF space but the thecal sac widely patent.  Mild facet
hypertrophy.  No stenosis.

T8-T9: Moderate central disc protrusion effaces the ventral CSF
space but the thecal sac is widely patent.  Mild facet hypertrophy.
No stenosis.

T9-T10:  Negative.

T10-T11: Negative.

T11-T12: Negative.
IMPRESSION: 1.  Small to moderate thoracic disc protrusions T8-T9 > T7-T8 > T6-
T7.  No associated spinal stenosis.
2.  Intermittent thoracic facet degeneration, including some fairly
severe levels with vacuum facet phenomena.  This is maximal at the
T3-T4 level on the right where there is associated moderate T3
foraminal stenosis.

## 2012-10-27 IMAGING — CT CT CERVICAL SPINE W/ CM
4 series · 14 of 33 positions shown, 16 images · IV contrast (omnipaque)
Comparison: Cervical MRI 03/02/2011.

MYELOGRAM CERVICAL AND THORACIC

CLINICAL DATA: 45-year-old female with neck pain and mid to upper
back pain.
TECHNIQUE: Intrathecal contrast was administered by Dr. Tiger
Felisha  via lumbar puncture at the L4-L5 level. Following
injection of intrathecal Omnipaque contrast, spine imaging in
multiple projections was performed using fluoroscopy.

Fluoroscopy Time: 0.4 minutes.
TECHNIQUE: CT imaging of the cervical spine was performed after
intrathecal contrast administration.  Multiplanar CT image
reconstructions were also generated.
TECHNIQUE: CT imaging of the thoracic spine was performed after

[Series 4: 2mm axial soft tissue · axial · 0.27mm/px · z∈[-194,-168]mm · 2 of 92 slices shown]
[im 14/92  soft-tissue]
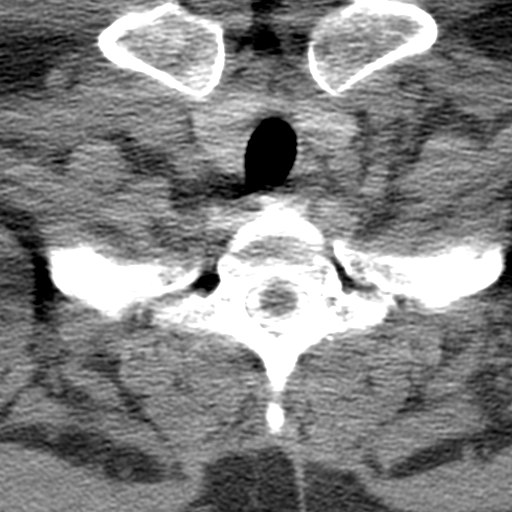
[im 27/92  soft-tissue]
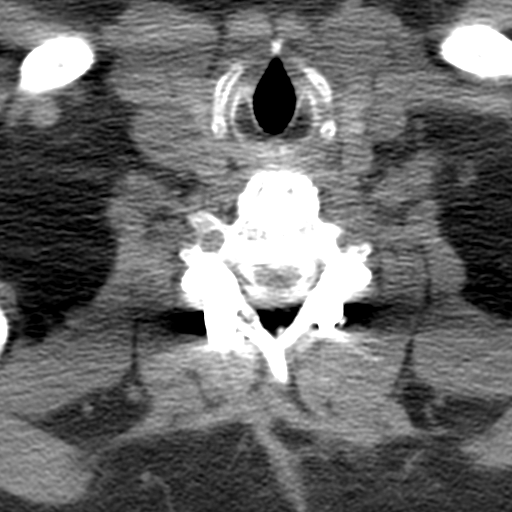

[Series 602: sta sagittals csp · sagittal · 0.36mm/px · 5 of 38 slices shown, 6 images]
[im 13/38  bone]
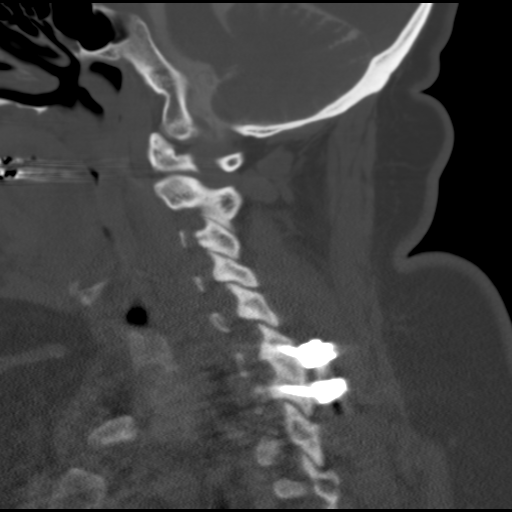
[im 16/38  bone]
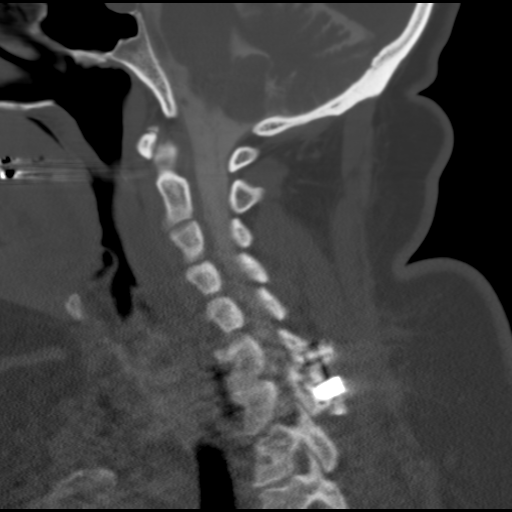
[im 19/38  soft-tissue]
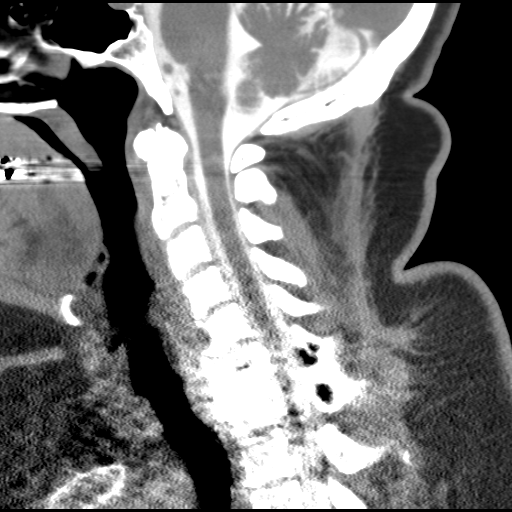
[im 19/38  bone]
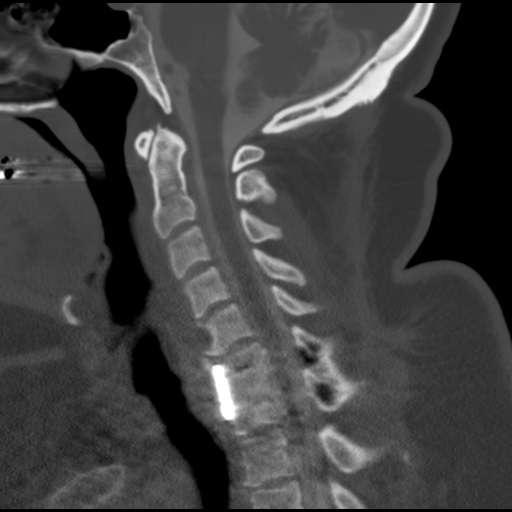
[im 22/38  bone]
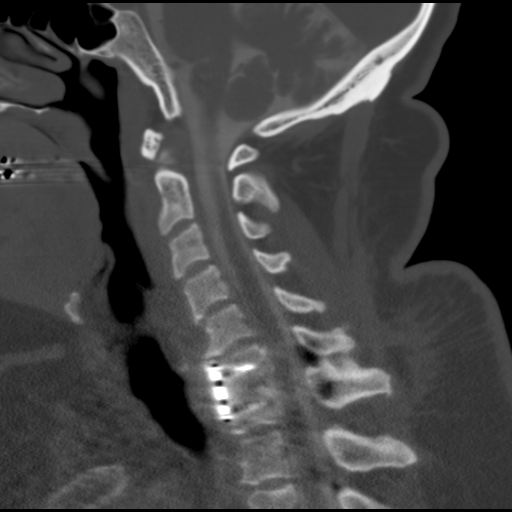
[im 25/38  bone]
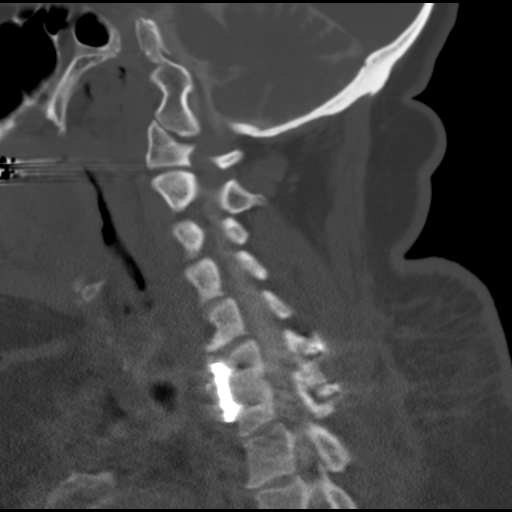

[Series 603: stacspcoro · coronal · 0.36mm/px · 3 of 36 slices shown]
[im 8/36  bone]
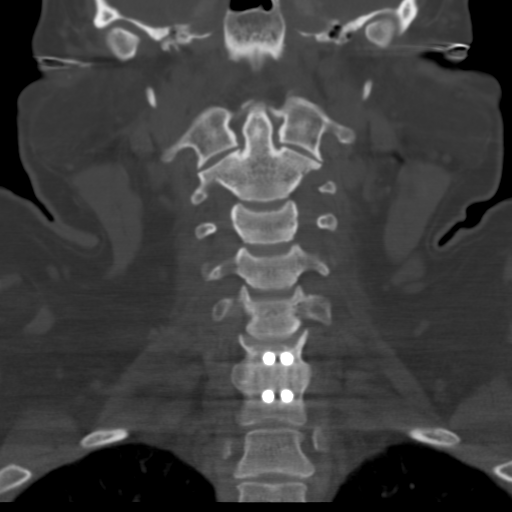
[im 15/36  bone]
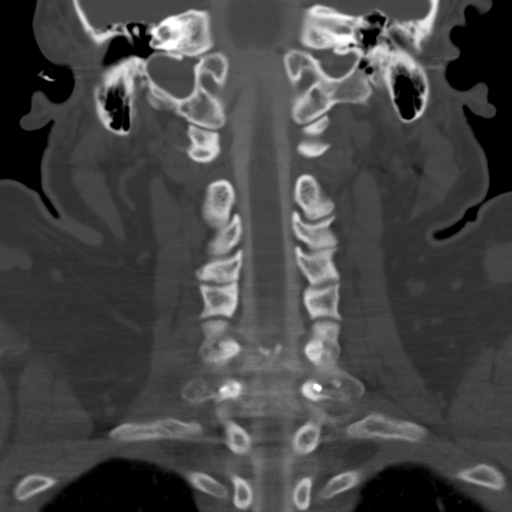
[im 22/36  bone]
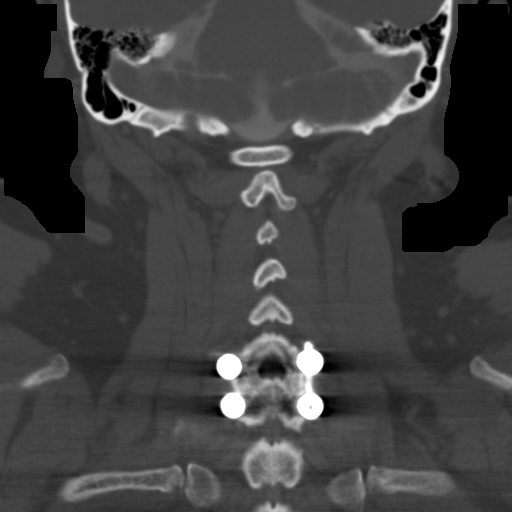

[Series 604: add ang cspine · axial · 0.26mm/px · z∈[-192,-104]mm · 4 of 77 slices shown, 5 images]
[im 16/77  soft-tissue]
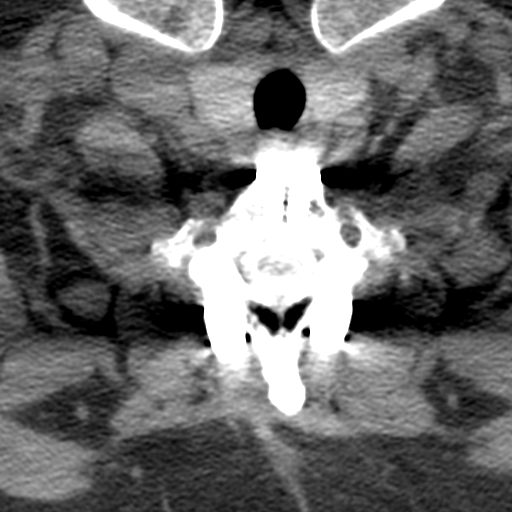
[im 16/77  bone]
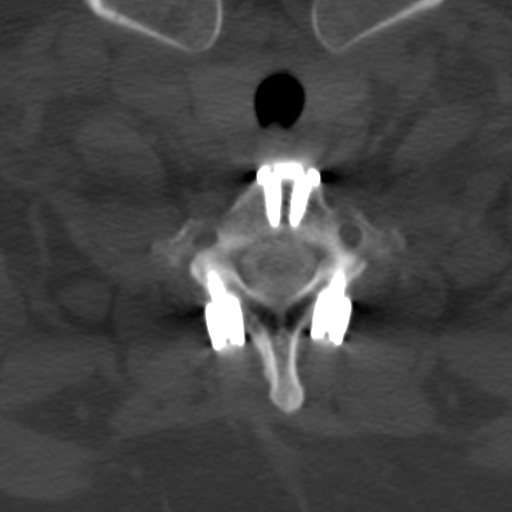
[im 31/77  bone]
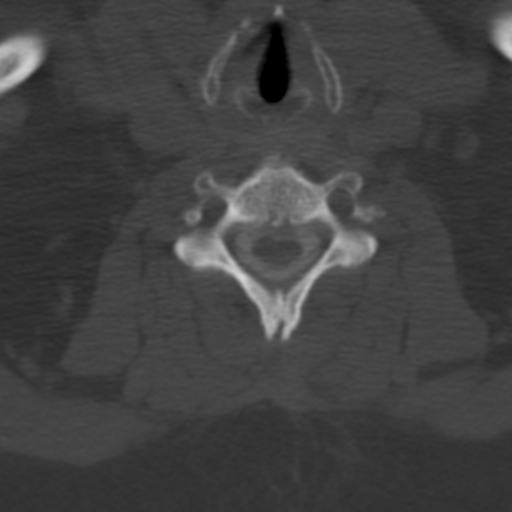
[im 46/77  bone]
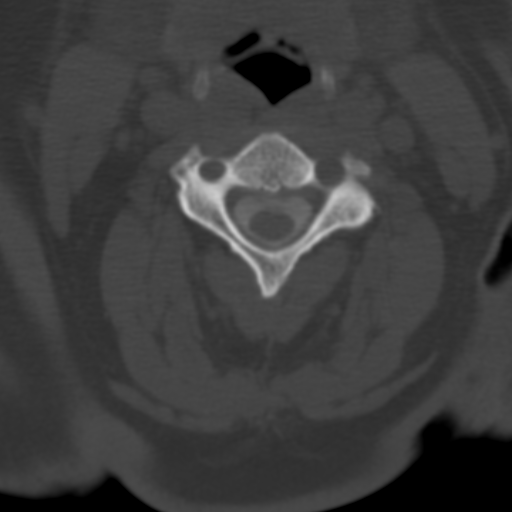
[im 61/77  bone]
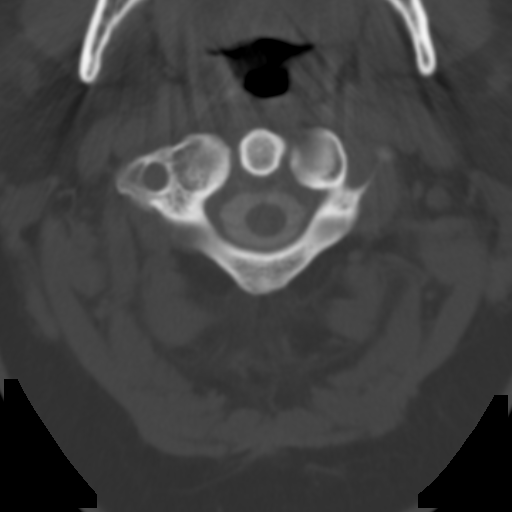

[14 of 33 positions shown; findings below may reference images not displayed]

FINDINGS: Good intrathecal contrast opacification.  Using gravity
contrast was transmitted into the cervical and thoracic spine.
ACDF hardware at C6-C7 with bilateral laminar hardware.  The lower
cervical levels difficult to visualize myelographic lead related to
body habitus, with no convincing cervical spinal stenosis.  Upper
cervical thecal sac widely patent.  Visualized thoracic thecal sac
also within normal limits.
IMPRESSION: 1.  Prior ACDF and posterior laminar hardware in place at C6-C7.
2. No convincing cervical or thoracic spinal stenosis by
myelography.
3. See post myelogram CT findings below.

CT MYELOGRAPHY CERVICAL SPINE
FINDINGS: Intrathecal/subarachnoid contrast.  Incidental
retropharyngeal course of the carotid arteries, more so the right.
Visualized skull base is intact.  No atlanto-occipital
dissociation.  Bilateral posterior element alignment is within
normal limits.  Cervicothoracic junction alignment is within normal
limits.  No acute osseous abnormality identified.  Visualized
paranasal sinuses and mastoids are clear.  Negative visualized
brain parenchyma.

C2-C3:  Mild facet hypertrophy is stable.  Mild uncovertebral
hypertrophy greater on the left.  No spinal stenosis.  No
significant foraminal stenosis.

C3-C4:  Stable facet hypertrophy.  No stenosis.

C4-C5:  Stable mild right facet hypertrophy.  Central disc
protrusion appears diminished.  The ventral CSF space is effaced
but no significant spinal stenosis suspected (AP thecal sac 10 mm
or greater).  No foraminal stenosis.

C5-C6:  Anterior eccentric disc osteophyte complex.  Previously
seen broad-based posterior disc protrusion is less apparent,
although the ventral CSF space is effaced.  Still the AP thecal sac
measures at least 9 mm without evidence of significant spinal
stenosis.  No foraminal stenosis.

C6-C7:  ACDF.  Solid interbody arthrodesis.  Anterior hardware
intact.  Posterior laminar hardware appears well placed and intact.
No stenosis.

C7-T1:  Negative.
IMPRESSION: 1.  C6-C7 ACDF with solid arthrodesis and no adverse features.
2.  Chronic C5-C6 disc degeneration, and upper cervical facet
hypertrophy.  But no convincing cervical spinal stenosis or neural
impingement.
3.  See thoracic post myelogram CT findings below.

CT MYELOGRAPHY THORACIC SPINE
FINDINGS: Mild respiratory motion artifact.  No focal lung
abnormality is evident.  Visualized noncontrast mediastinal soft
tissues within normal limits.  Visualized noncontrast upper
abdominal viscera within normal limits.

Normal thoracic segmentation.  Preserved thoracic vertebral height
and alignment. Bone mineralization is within normal limits.
Thoracic spinal cord appears normal.  Conus medullaris partially
visualized at L1.

T1-T2: Negative.

T2-T3: Moderate facet hypertrophy greater on the right where there
is vacuum phenomena.  Negative disc.  No stenosis.

T3-T4: Severe facet hypertrophy greater on the right where there is
extensive vacuum phenomena.  Negative disc.  Moderate right T3
foraminal stenosis related to facet spurring.

T4-T5: Mild to moderate facet hypertrophy slightly greater on the
right with trace vacuum phenomena.  Minimal disc bulge.  Mild left
uncovertebral hypertrophy.  No significant stenosis.

T5-T6: Moderate bilateral facet hypertrophy with vacuum phenomena.
Negative disc.  No stenosis.

T6-T7: Small partially calcified right paracentral disc protrusion
narrows the ventral CSF space but thecal sac is widely patent.
Mild facet hypertrophy.  No stenosis.

T7-T8: Small to moderate right paracentral disc protrusion effaces
the ventral CSF space but the thecal sac widely patent.  Mild facet
hypertrophy.  No stenosis.

T8-T9: Moderate central disc protrusion effaces the ventral CSF
space but the thecal sac is widely patent.  Mild facet hypertrophy.
No stenosis.

T9-T10:  Negative.

T10-T11: Negative.

T11-T12: Negative.
IMPRESSION: 1.  Small to moderate thoracic disc protrusions T8-T9 > T7-T8 > T6-
T7.  No associated spinal stenosis.
2.  Intermittent thoracic facet degeneration, including some fairly
severe levels with vacuum facet phenomena.  This is maximal at the
T3-T4 level on the right where there is associated moderate T3
foraminal stenosis.

## 2013-03-18 ENCOUNTER — Other Ambulatory Visit: Payer: Self-pay | Admitting: Neurosurgery

## 2013-03-27 ENCOUNTER — Ambulatory Visit
Admission: RE | Admit: 2013-03-27 | Discharge: 2013-03-27 | Disposition: A | Discharge status: Home / Self Care | Payer: BC Managed Care – PPO | Source: Ambulatory Visit | Attending: Neurosurgery | Admitting: Neurosurgery

## 2017-04-01 ENCOUNTER — Ambulatory Visit: Admission: RE | Admit: 2017-04-01 | Discharge: 1898-10-06

## 2017-05-01 NOTE — Unmapped (Signed)
Specialty Pharmacy Refill Coordination Note     Casey Johnson is a 51 y.o. female contacted today regarding refills of her specialty medication(s).    Reviewed and verified with patient:     [address]  [place] [address]  Specialty medication(s) and dose(s) confirmed: yes  Changes to medications: no  Changes to insurance: no    Medication Adherence    Patient reported X missed doses in the last month:  0  Specialty Medication:  CIMZIA  Medication Assistance Program  Refill Coordination  Has the Patient's Contact Information Changed:  No  Is the Shipping Address Different:  No  Shipping Information  Delivery Scheduled:  Yes  Delivery Date:  05/06/17               Ardyth Man  Specialty Pharmacy Technician

## 2017-05-04 ENCOUNTER — Ambulatory Visit: Admission: RE | Admit: 2017-05-04 | Discharge: 2017-05-04 | Payer: Commercial Managed Care - PPO

## 2017-05-05 MED FILL — CIMZIA KIT/KIT2X200MG/ML/KIT: CIMZIA KIT/KIT2X200MG/ML/KIT | 28 days supply | Qty: 1 | Fill #3

## 2017-05-05 NOTE — Unmapped (Signed)
Reason for visit:   9 month post bariatric nutrition visit    Anthropometrics:     WT:   224.9 lbs     HT: 66   BMI:  36.3 kg/m2    Assessment:  Pt is a 51 yo f who had Sleeve Gastrectomy October 2017.  Pt is still having hair loss and low energy. Re-calculated protein intake and pt is only getting about 34 g daily. Pt has found a protein powder she can tolerate, so recommended she drink 2 of those daily. Pt is confused on her MVI and mineral supplementation. Review was provided.      Plan:  1) Increase protein intake to at least 80 g daily.  2) Need higher protein breakfast than the egg, but consume an egg at lunch.  3) Email RDN vitamin and mineral supplement labels.            Time spent with pt: 30  minutes

## 2017-05-13 NOTE — Unmapped (Signed)
Pt concerned with lack of weight loss and she feels her stomach is getting bigger.  Pt has lost 61 lbs since surgery in October 2017. She has struggled with protein intake during that time, but has finally been able to meet her goal of 80 g daily. Pt is not exercising due to her work schedule.  RDN strongly recommended she began an exercise program and continue with high protein intake. PA will re-assess her at the annual follow up scheduled in October.

## 2017-05-13 NOTE — Unmapped (Signed)
Pt would like a call back regarding her lack of weight loss.

## 2017-05-27 NOTE — Unmapped (Signed)
Specialty Pharmacy Refill Coordination Note     Casey Johnson is a 51 y.o. female contacted today regarding refills of her specialty medication(s).    Reviewed and verified with patient:      Specialty medication(s) and dose(s) confirmed: yes  Changes to medications: no  Changes to insurance: no    Medication Adherence    Medication Assistance Program  Refill Coordination  Has the Patient's Contact Information Changed:  No  Is the Shipping Address Different:  No  Shipping Information  Delivery Scheduled:  Yes  Delivery Date:  06/03/17  Medications to be Shipped:  CIMZIA          Follow-up: 3 week(s)     Mitzi Davenport  Specialty Pharmacy Technician

## 2017-06-01 NOTE — Unmapped (Signed)
Noticed pt had cancelled her nutrition appt this week and did not reschedule. RDN was calling to follow up on hair loss and weight plateau.  Hair is still coming out in clumps and weight is not moving.  Pt has met protein goal and tried other recommendations, to no avail. RDN will contact bariatric colleagues and [facility] documentation to determine if anything more can be done. We will reschedule once RDN has information to provide.

## 2017-06-02 MED FILL — CIMZIA KIT/KIT2X200MG/ML/KIT: CIMZIA KIT/KIT2X200MG/ML/KIT | 28 days supply | Qty: 1 | Fill #4

## 2017-06-22 NOTE — Unmapped (Signed)
[facility] Refill and Clinical Coordination Note  Medication(s): Cimzia    Casey Johnson, DOB: 05-17-1966  Phone: [phone] (home) [phone] (work), Alternate phone contact: N/A  Shipping address: [address]  [place] [address]  Phone or address changes today?: No  All above HIPAA information verified.  Insurance changes? No    Completed refill and clinical call assessment today to schedule patient's medication shipment from the [facility] [phone]).      MEDICATION RECONCILIATION    Confirmed the medication and dosage are correct and have not changed: Yes, regimen is correct and unchanged.    Were there any changes to your medication(s) in the past month:  No, there are no changes reported at this time.    ADHERENCE    Is this medicine transplant or covered by Medicare Part B? No.      Did you miss any doses in the past 4 weeks? No missed doses reported.  Adherence counseling provided? Not needed     SIDE EFFECT MANAGEMENT    Are you tolerating your medication?:  Casey Johnson reports tolerating the medication.  Side effect management discussed: None      Therapy is appropriate and should be continued.    Evidence of clinical benefit: Patient sees outside provider, I have no access to clinical notes. Patient feels well and wants to continue Cimzia.      FINANCIAL/SHIPPING    Delivery Scheduled: Yes, Expected medication delivery date: 9/26   Additional medications refilled: No additional medications/refills needed at this time.    Casey Johnson did not have any additional questions at this time.    Delivery address validated in [facility] scheduling system: Yes, address listed above is correct.      We will follow up with patient monthly for standard refill processing and delivery.      Thank you,  Clydell Hakim   [facility] Specialty Pharmacist

## 2017-06-30 NOTE — Unmapped (Signed)
[facility] patient, cant fill at [facility] anymore. Must fill at [facility]

## 2019-08-11 ENCOUNTER — Other Ambulatory Visit: Payer: Self-pay | Admitting: Neurosurgery

## 2019-08-11 DIAGNOSIS — Q761 Klippel-Feil syndrome: Secondary | ICD-10-CM

## 2019-09-05 ENCOUNTER — Ambulatory Visit
Admission: RE | Admit: 2019-09-05 | Discharge: 2019-09-05 | Disposition: A | Discharge status: Home / Self Care | Payer: PRIVATE HEALTH INSURANCE | Source: Ambulatory Visit | Attending: Neurosurgery | Admitting: Neurosurgery

## 2019-09-05 ENCOUNTER — Other Ambulatory Visit: Payer: Self-pay

## 2019-09-05 DIAGNOSIS — Q761 Klippel-Feil syndrome: Secondary | ICD-10-CM

## 2021-05-13 ENCOUNTER — Telehealth: Payer: Self-pay

## 2021-05-13 ENCOUNTER — Other Ambulatory Visit: Payer: Self-pay | Admitting: Neurosurgery

## 2021-05-13 DIAGNOSIS — M5442 Lumbago with sciatica, left side: Secondary | ICD-10-CM

## 2021-05-13 DIAGNOSIS — G8929 Other chronic pain: Secondary | ICD-10-CM

## 2021-05-13 MED ORDER — PREDNISONE 50 MG PO TABS: ORAL_TABLET | ORAL | 0 refills | Status: AC

## 2021-05-13 NOTE — Telephone Encounter (Signed)
Phone call to patient to review instructions for 13 hr prep for CT Myelogram w/ contrast on 05/23/21 at 9:30 AM. Prescription called into [facility]. Pt aware and verbalized understanding of instructions. Prescription: Pt to take 50 mg of prednisone on 05/22/21 at 8:30 PM, 50 mg of prednisone on 05/23/21 at 2:30 AM, and 50 mg of prednisone on 05/23/21 at 8:30 AM. Pt is also to take 50 mg of benadryl on 05/23/21 at 8:30 AM. Please call [phone] with any questions.    Pt reports she has benadryl at home. Therefor, benadryl was not called in as a prescription. Pt verbalized understanding of when and how much benadryl to take. Pt also advised to have a driver the day of consuming these medications as Benadryl may cause drowsiness. Pt verbalized understanding.

## 2021-05-23 ENCOUNTER — Ambulatory Visit
Admission: RE | Admit: 2021-05-23 | Discharge: 2021-05-23 | Disposition: A | Discharge status: Home / Self Care | Payer: No Typology Code available for payment source | Source: Ambulatory Visit | Attending: Neurosurgery | Admitting: Neurosurgery

## 2021-05-23 ENCOUNTER — Other Ambulatory Visit: Payer: Self-pay

## 2021-05-23 DIAGNOSIS — G8929 Other chronic pain: Secondary | ICD-10-CM

## 2021-05-23 MED ORDER — DIAZEPAM 5 MG PO TABS
10.0000 mg | ORAL_TABLET | Freq: Once | ORAL | Status: AC
  Administered 2021-05-23: 10 mg via ORAL

## 2021-05-23 MED ORDER — IOPAMIDOL (ISOVUE-M 200) INJECTION 41%
18.0000 mL | Freq: Once | INTRAMUSCULAR | Status: AC
  Administered 2021-05-23: 18 mL via INTRATHECAL

## 2021-05-23 MED ORDER — ONDANSETRON HCL 4 MG/2ML IJ SOLN: 4.0000 mg | Freq: Four times a day (QID) | INTRAMUSCULAR | Status: DC | PRN

## 2021-05-23 NOTE — Progress Notes (Signed)
Pt reports she took her 13 hr prep prior to arriving for myelogram procedure.

## 2021-05-23 NOTE — Discharge Instructions (Signed)

## 2021-11-06 ENCOUNTER — Other Ambulatory Visit: Payer: Self-pay | Admitting: Neurosurgery

## 2021-11-06 DIAGNOSIS — M542 Cervicalgia: Secondary | ICD-10-CM

## 2021-11-30 ENCOUNTER — Ambulatory Visit
Admission: RE | Admit: 2021-11-30 | Discharge: 2021-11-30 | Disposition: A | Discharge status: Home / Self Care | Payer: No Typology Code available for payment source | Source: Ambulatory Visit | Attending: Neurosurgery | Admitting: Neurosurgery

## 2021-11-30 ENCOUNTER — Other Ambulatory Visit: Payer: Self-pay

## 2021-11-30 DIAGNOSIS — M542 Cervicalgia: Secondary | ICD-10-CM

## 2024-02-05 ENCOUNTER — Other Ambulatory Visit: Payer: Self-pay | Admitting: Neurosurgery

## 2024-02-05 DIAGNOSIS — G8929 Other chronic pain: Secondary | ICD-10-CM
